# Patient Record
Sex: Female | Born: 2000 | Hispanic: Yes | Marital: Single | Smoking: Never smoker
Health system: Southern US, Community
[De-identification: ages and names within clinical notes are randomized; demographics above are authoritative.]

## PROBLEM LIST (undated history)

## (undated) HISTORY — PX: TONSILLECTOMY: SUR1361

---
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
## 2018-07-21 ENCOUNTER — Other Ambulatory Visit
Admission: RE | Admit: 2018-07-21 | Discharge: 2018-07-21 | Disposition: A | Discharge status: Home / Self Care | Payer: Medicaid Other | Source: Ambulatory Visit | Attending: Pediatrics | Admitting: Pediatrics

## 2018-07-21 DIAGNOSIS — E669 Obesity, unspecified: Secondary | ICD-10-CM | POA: Insufficient documentation

## 2018-07-21 LAB — CBC WITH DIFFERENTIAL/PLATELET
Abs Immature Granulocytes: 0.02 10*3/uL (ref 0.00–0.07)
BASOS ABS: 0 10*3/uL (ref 0.0–0.1)
Basophils Relative: 0 %
Eosinophils Absolute: 0.1 10*3/uL (ref 0.0–0.5)
Eosinophils Relative: 1 %
HCT: 44.4 % (ref 36.0–46.0)
HEMOGLOBIN: 14.7 g/dL (ref 12.0–15.0)
Immature Granulocytes: 0 %
LYMPHS PCT: 36 %
Lymphs Abs: 2 10*3/uL (ref 0.7–4.0)
MCH: 29.1 pg (ref 26.0–34.0)
MCHC: 33.1 g/dL (ref 30.0–36.0)
MCV: 87.7 fL (ref 80.0–100.0)
MONOS PCT: 6 %
Monocytes Absolute: 0.3 10*3/uL (ref 0.1–1.0)
NEUTROS ABS: 3.1 10*3/uL (ref 1.7–7.7)
NRBC: 0 % (ref 0.0–0.2)
Neutrophils Relative %: 57 %
Platelets: 218 10*3/uL (ref 150–400)
RBC: 5.06 MIL/uL (ref 3.87–5.11)
RDW: 12 % (ref 11.5–15.5)
WBC: 5.5 10*3/uL (ref 4.0–10.5)

## 2018-07-21 LAB — COMPREHENSIVE METABOLIC PANEL
ALK PHOS: 74 U/L (ref 38–126)
ALT: 35 U/L (ref 0–44)
AST: 30 U/L (ref 15–41)
Albumin: 4.2 g/dL (ref 3.5–5.0)
Anion gap: 6 (ref 5–15)
BUN: 13 mg/dL (ref 6–20)
CO2: 26 mmol/L (ref 22–32)
Calcium: 9.1 mg/dL (ref 8.9–10.3)
Chloride: 107 mmol/L (ref 98–111)
Creatinine, Ser: 0.72 mg/dL (ref 0.44–1.00)
GFR calc Af Amer: >60 mL/min (ref >60)
GFR calc non Af Amer: >60 mL/min (ref >60)
Glucose, Bld: 89 mg/dL (ref 70–99)
POTASSIUM: 3.7 mmol/L (ref 3.5–5.1)
SODIUM: 139 mmol/L (ref 135–145)
Total Bilirubin: 0.6 mg/dL (ref 0.3–1.2)
Total Protein: 7.2 g/dL (ref 6.5–8.1)

## 2018-07-21 LAB — HEMOGLOBIN A1C
Hgb A1c MFr Bld: 5 % (ref 4.8–5.6)
Mean Plasma Glucose: 96.8 mg/dL

## 2018-07-21 LAB — HCG, QUANTITATIVE, PREGNANCY: hCG, Beta Chain, Quant, S: <1 m[IU]/mL (ref <5)

## 2018-07-21 LAB — LIPID PANEL
Cholesterol: 157 mg/dL (ref 0–200)
HDL: 49 mg/dL (ref >40)
LDL Cholesterol: 92 mg/dL (ref 0–99)
Total CHOL/HDL Ratio: 3.2 RATIO
Triglycerides: 78 mg/dL (ref <150)
VLDL: 16 mg/dL (ref 0–40)

## 2018-07-23 LAB — VITAMIN D 25 HYDROXY (VIT D DEFICIENCY, FRACTURES): Vit D, 25-Hydroxy: 8 ng/mL — ABNORMAL LOW (ref 30.0–100.0)

## 2018-07-23 LAB — INSULIN, RANDOM: Insulin: 21.6 u[IU]/mL (ref 2.6–24.9)

## 2018-09-04 ENCOUNTER — Encounter: Payer: Self-pay | Admitting: Obstetrics and Gynecology

## 2018-09-04 ENCOUNTER — Ambulatory Visit (INDEPENDENT_AMBULATORY_CARE_PROVIDER_SITE_OTHER): Payer: Medicaid Other | Admitting: Obstetrics and Gynecology

## 2018-09-04 VITALS — BP 100/67 | HR 75 | Ht 61.0 in | Wt 174.0 lb

## 2018-09-04 DIAGNOSIS — E282 Polycystic ovarian syndrome: Secondary | ICD-10-CM

## 2018-09-04 DIAGNOSIS — Z30011 Encounter for initial prescription of contraceptive pills: Secondary | ICD-10-CM | POA: Diagnosis not present

## 2018-09-04 DIAGNOSIS — Z3009 Encounter for other general counseling and advice on contraception: Secondary | ICD-10-CM | POA: Diagnosis not present

## 2018-09-04 MED ORDER — DESOGESTREL-ETHINYL ESTRADIOL 0.15-0.02/0.01 MG (21/5) PO TABS: 1.0000 | ORAL_TABLET | Freq: Every day | ORAL | 1 refills | Status: AC

## 2018-09-04 NOTE — Progress Notes (Signed)
HPI:      Teresa Yates is a 18 y.o. G0P0000 who LMP was Patient's last menstrual period was 08/28/2018.  Subjective:   She presents today because her cycles have been irregular since stopping OCPs.  She is no longer attempting pregnancy and would like something to control her menstrual cycles.  She is not currently sexually active. Of significant note she has a previous history of PCO and irregular cycles.    Hx: The following portions of the patient's history were reviewed and updated as appropriate:             She  has no past medical history on file. She does not have any pertinent problems on file. She  has a past surgical history that includes Tonsillectomy. Her family history includes Diabetes in her maternal grandmother, maternal uncle, and mother. She  reports that she has never smoked. She has never used smokeless tobacco. She reports that she does not drink alcohol or use drugs. She has a current medication list which includes the following prescription(s): desogestrel-ethinyl estradiol. She has No Known Allergies.       Review of Systems:  Review of Systems  Constitutional: Denied constitutional symptoms, night sweats, recent illness, fatigue, fever, insomnia and weight loss.  Eyes: Denied eye symptoms, eye pain, photophobia, vision change and visual disturbance.  Ears/Nose/Throat/Neck: Denied ear, nose, throat or neck symptoms, hearing loss, nasal discharge, sinus congestion and sore throat.  Cardiovascular: Denied cardiovascular symptoms, arrhythmia, chest pain/pressure, edema, exercise intolerance, orthopnea and palpitations.  Respiratory: Denied pulmonary symptoms, asthma, pleuritic pain, productive sputum, cough, dyspnea and wheezing.  Gastrointestinal: Denied, gastro-esophageal reflux, melena, nausea and vomiting.  Genitourinary: Denied genitourinary symptoms including symptomatic vaginal discharge, pelvic relaxation issues, and urinary complaints.   Musculoskeletal: Denied musculoskeletal symptoms, stiffness, swelling, muscle weakness and myalgia.  Dermatologic: Denied dermatology symptoms, rash and scar.  Neurologic: Denied neurology symptoms, dizziness, headache, neck pain and syncope.  Psychiatric: Denied psychiatric symptoms, anxiety and depression.  Endocrine: Denied endocrine symptoms including hot flashes and night sweats.   Meds:   No current outpatient medications on file prior to visit.   No current facility-administered medications on file prior to visit.     Objective:     Vitals:   09/04/18 1442  BP: 100/67  Pulse: 75                Assessment:    G0P0000 Patient Active Problem List   Diagnosis Date Noted  . Irregular menses 10/03/2017     1. Birth control counseling   2. Initiation of OCP (BCP)   3. PCO (polycystic ovaries)        Plan:            1.  Patient desires OCPs to regulate her menses.  She is having very irregular cycles when not on them.  2.   OCPs The risks /benefits of OCPs have been explained to the patient in detail.  Product literature has been given to her.  I have instructed her in the use of OCPs and have given her literature reinforcing this information.  I have explained to the patient that OCPs are not as effective for birth control during the first month of use, and that another form of contraception should be used during this time.  Both first-day start and Sunday start have been explained.  The risks and benefits of each was discussed.  She has been made aware of  the fact that other medications may affect  the efficacy of OCPs.  I have answered all of her questions, and I believe that she has an understanding of the effectiveness and use of OCPs.  Orders No orders of the defined types were placed in this encounter.    Meds ordered this encounter  Medications  . desogestrel-ethinyl estradiol (MIRCETTE) 0.15-0.02/0.01 MG (21/5) tablet    Sig: Take 1 tablet by mouth at  bedtime.    Dispense:  3 Package    Refill:  1      F/U  Return in about 4 months (around 01/03/2019). I spent 18 minutes involved in the care of this patient of which greater than 50% was spent discussing OCPs for cycle regulation, PCO, future pregnancy, starting and taking OCPs.  Use of other birth control in the first month.  All questions answered.  Elonda Husky, M.D. 09/04/2018 3:18 PM

## 2018-09-04 NOTE — Progress Notes (Signed)
Patient comes in today to discuss going back on birth control. She has been on oral BC before for her irregular cycles. She stopped to get pregnant. Her and her partner has split up since then and she has not been trying to get pregnant. She has not had a period in a year until 08/28/2018. She would like to go back on the [facility] to help with her cycles.
